# Patient Record
Sex: Female | Born: 1965 | Race: Asian | Hispanic: No | Marital: Married | Smoking: Never smoker
Health system: Southern US, Community
[De-identification: ages and names within clinical notes are randomized; demographics above are authoritative.]

## PROBLEM LIST (undated history)

## (undated) DIAGNOSIS — D241 Benign neoplasm of right breast: Secondary | ICD-10-CM

## (undated) HISTORY — PX: DILATION AND CURETTAGE OF UTERUS: SHX78

## (undated) HISTORY — PX: TUBAL LIGATION: SHX77

## (undated) HISTORY — PX: BREAST BIOPSY: SHX20

---

## 1997-09-25 ENCOUNTER — Emergency Department (HOSPITAL_COMMUNITY): Admission: EM | Admit: 1997-09-25 | Discharge: 1997-09-25 | Payer: Self-pay | Admitting: Emergency Medicine

## 1997-12-29 ENCOUNTER — Ambulatory Visit (HOSPITAL_COMMUNITY): Admission: RE | Admit: 1997-12-29 | Discharge: 1997-12-29 | Payer: Self-pay | Admitting: Obstetrics & Gynecology

## 1998-05-28 ENCOUNTER — Inpatient Hospital Stay (HOSPITAL_COMMUNITY): Admission: AD | Admit: 1998-05-28 | Discharge: 1998-05-28 | Payer: Self-pay | Admitting: *Deleted

## 1998-05-30 ENCOUNTER — Inpatient Hospital Stay (HOSPITAL_COMMUNITY): Admission: AD | Admit: 1998-05-30 | Discharge: 1998-06-02 | Payer: Self-pay | Admitting: Obstetrics & Gynecology

## 2000-01-05 ENCOUNTER — Encounter: Admission: RE | Admit: 2000-01-05 | Discharge: 2000-01-05 | Payer: Self-pay | Admitting: Family Medicine

## 2000-01-05 ENCOUNTER — Encounter: Payer: Self-pay | Admitting: Family Medicine

## 2003-01-18 ENCOUNTER — Inpatient Hospital Stay (HOSPITAL_COMMUNITY): Admission: AD | Admit: 2003-01-18 | Discharge: 2003-01-20 | Payer: Self-pay | Admitting: Obstetrics & Gynecology

## 2003-01-18 ENCOUNTER — Encounter (INDEPENDENT_AMBULATORY_CARE_PROVIDER_SITE_OTHER): Payer: Self-pay | Admitting: Specialist

## 2009-01-28 ENCOUNTER — Other Ambulatory Visit: Admission: RE | Admit: 2009-01-28 | Discharge: 2009-01-28 | Payer: Self-pay | Admitting: Family Medicine

## 2010-08-04 NOTE — Op Note (Signed)
NAME:  Heather Yang, Heather Yang                          ACCOUNT NO.:  [id number]   MEDICAL RECORD NO.:  [id number]                   PATIENT TYPE:  INP   LOCATION:  9137                                 FACILITY:  [facility]   PHYSICIAN:  Charles A. Clearance Coots, M.D.             DATE OF BIRTH:  1965/11/13   DATE OF PROCEDURE:  01/18/2003  DATE OF DISCHARGE:                                 OPERATIVE REPORT   PREOPERATIVE DIAGNOSIS:  Desires sterilization.   POSTOPERATIVE DIAGNOSIS:  Desires sterilization.   PROCEDURE:  Bilateral partial salpingectomy (Pomeroy technique).   SURGEON:  Charles A. Clearance Coots, M.D.   ANESTHESIA:  General.   ESTIMATED BLOOD LOSS:  Negligible.   COMPLICATIONS:  None.   SPECIMENS:  Approximately 2 cm segments of right and left fallopian tubes.   OPERATION:  The patient was brought to the operating room and after  satisfactory general endotracheal anesthesia, the abdomen was prepped and  draped in the usual sterile fashion.  A small inferior umbilical incision  was made with the scalpel that was deepened down to the fascia bluntly with  curved Mayo scissors.  The fascia was grasped in the midline with Kelly  forceps and was cut inbetween the forceps with curved Mayo scissors.  The  fascial incision was extended to the left and the right with the curved Mayo  scissors.  The parietoperitoneum was grasped with hemostats and was incised  with Metzenbaum scissors.  Right angle retractors were placed in the  incision and the right fallopian tube was identified and grasped with  Babcock clamp.  The tube was followed from the cornual end to the fimbrial  end serially and grasped with Babcock clamps and then followed retrograde  back to the isthmic area of the tube with the Babcock clamp.  A knuckle of  tube beneath the Babcock clamp in the isthmic portion of the tube was doubly  ligated with #1 plain catgut and the section of tube above the knot was  excised with Metzenbaum scissors  and submitted to pathology for evaluation.  There was no active bleeding from the tube stump, it was placed back in its  normal anatomic position.  The same procedure was performed on the opposite  side without complications.  The abdomen was then closed as follows:  The  peritoneum and fascia was closed as one with continuous suture of 2-0  Vicryl.  The skin was reapproximated with a continuous subcuticular suture  of 3-0 Monocryl.  A sterile bandage was applied to the incision closure.  The surgical technician indicated that all needle, sponge, and instrument  counts were correct.  The patient tolerated the procedure well and was  transferred to the recovery room in satisfactory condition.  Charles A. Clearance Coots, M.D.    CAH/MEDQ  D:  01/18/2003  T:  01/18/2003  Job:  [id number]

## 2011-11-07 ENCOUNTER — Other Ambulatory Visit: Payer: Self-pay | Admitting: Family Medicine

## 2011-11-07 ENCOUNTER — Other Ambulatory Visit (HOSPITAL_COMMUNITY)
Admission: RE | Admit: 2011-11-07 | Discharge: 2011-11-07 | Disposition: A | Discharge status: Home / Self Care | Payer: BC Managed Care – PPO | Source: Ambulatory Visit | Attending: Family Medicine | Admitting: Family Medicine

## 2011-11-07 DIAGNOSIS — Z0000 Encounter for general adult medical examination without abnormal findings: Secondary | ICD-10-CM | POA: Insufficient documentation

## 2011-11-07 DIAGNOSIS — N6311 Unspecified lump in the right breast, upper outer quadrant: Secondary | ICD-10-CM

## 2011-12-03 ENCOUNTER — Other Ambulatory Visit: Payer: Self-pay | Admitting: Family Medicine

## 2011-12-03 ENCOUNTER — Ambulatory Visit
Admission: RE | Admit: 2011-12-03 | Discharge: 2011-12-03 | Disposition: A | Discharge status: Home / Self Care | Payer: BC Managed Care – PPO | Source: Ambulatory Visit | Attending: Family Medicine | Admitting: Family Medicine

## 2011-12-03 DIAGNOSIS — N6311 Unspecified lump in the right breast, upper outer quadrant: Secondary | ICD-10-CM

## 2012-01-02 ENCOUNTER — Ambulatory Visit
Admission: RE | Admit: 2012-01-02 | Discharge: 2012-01-02 | Disposition: A | Discharge status: Home / Self Care | Payer: BC Managed Care – PPO | Source: Ambulatory Visit | Attending: Family Medicine | Admitting: Family Medicine

## 2012-01-02 ENCOUNTER — Other Ambulatory Visit: Payer: Self-pay | Admitting: Family Medicine

## 2012-01-02 DIAGNOSIS — N6311 Unspecified lump in the right breast, upper outer quadrant: Secondary | ICD-10-CM

## 2013-02-17 ENCOUNTER — Other Ambulatory Visit: Payer: Self-pay | Admitting: Family Medicine

## 2013-02-17 DIAGNOSIS — R921 Mammographic calcification found on diagnostic imaging of breast: Secondary | ICD-10-CM

## 2013-03-03 ENCOUNTER — Ambulatory Visit
Admission: RE | Admit: 2013-03-03 | Discharge: 2013-03-03 | Disposition: A | Discharge status: Home / Self Care | Payer: BC Managed Care – PPO | Source: Ambulatory Visit | Attending: Family Medicine | Admitting: Family Medicine

## 2013-03-03 DIAGNOSIS — R921 Mammographic calcification found on diagnostic imaging of breast: Secondary | ICD-10-CM

## 2014-02-02 ENCOUNTER — Other Ambulatory Visit: Payer: Self-pay | Admitting: Family Medicine

## 2014-02-02 DIAGNOSIS — R921 Mammographic calcification found on diagnostic imaging of breast: Secondary | ICD-10-CM

## 2014-03-08 ENCOUNTER — Ambulatory Visit
Admission: RE | Admit: 2014-03-08 | Discharge: 2014-03-08 | Disposition: A | Discharge status: Home / Self Care | Payer: BC Managed Care – PPO | Source: Ambulatory Visit | Attending: Family Medicine | Admitting: Family Medicine

## 2014-03-08 DIAGNOSIS — R921 Mammographic calcification found on diagnostic imaging of breast: Secondary | ICD-10-CM

## 2015-04-19 ENCOUNTER — Other Ambulatory Visit (HOSPITAL_COMMUNITY)
Admission: RE | Admit: 2015-04-19 | Discharge: 2015-04-19 | Disposition: A | Discharge status: Home / Self Care | Payer: BC Managed Care – PPO | Source: Ambulatory Visit | Attending: Family Medicine | Admitting: Family Medicine

## 2015-04-19 ENCOUNTER — Other Ambulatory Visit: Payer: Self-pay | Admitting: Family Medicine

## 2015-04-19 DIAGNOSIS — Z124 Encounter for screening for malignant neoplasm of cervix: Secondary | ICD-10-CM | POA: Diagnosis present

## 2015-04-19 DIAGNOSIS — Z1151 Encounter for screening for human papillomavirus (HPV): Secondary | ICD-10-CM | POA: Diagnosis not present

## 2015-04-21 LAB — CYTOLOGY - PAP

## 2015-08-08 ENCOUNTER — Other Ambulatory Visit: Payer: Self-pay

## 2015-08-08 DIAGNOSIS — Z1231 Encounter for screening mammogram for malignant neoplasm of breast: Secondary | ICD-10-CM

## 2015-09-01 ENCOUNTER — Ambulatory Visit
Admission: RE | Admit: 2015-09-01 | Discharge: 2015-09-01 | Disposition: A | Discharge status: Home / Self Care | Payer: BLUE CROSS/BLUE SHIELD | Source: Ambulatory Visit

## 2015-09-01 ENCOUNTER — Other Ambulatory Visit: Payer: Self-pay | Admitting: Family Medicine

## 2015-09-01 DIAGNOSIS — Z1231 Encounter for screening mammogram for malignant neoplasm of breast: Secondary | ICD-10-CM

## 2016-08-23 ENCOUNTER — Emergency Department (HOSPITAL_COMMUNITY)
Admission: EM | Admit: 2016-08-23 | Discharge: 2016-08-23 | Disposition: A | Discharge status: Home / Self Care | Payer: BLUE CROSS/BLUE SHIELD | Attending: Emergency Medicine | Admitting: Emergency Medicine

## 2016-08-23 ENCOUNTER — Encounter (HOSPITAL_COMMUNITY): Payer: Self-pay | Admitting: Emergency Medicine

## 2016-08-23 DIAGNOSIS — S71152A Open bite, left thigh, initial encounter: Secondary | ICD-10-CM | POA: Insufficient documentation

## 2016-08-23 DIAGNOSIS — Y939 Activity, unspecified: Secondary | ICD-10-CM | POA: Diagnosis not present

## 2016-08-23 DIAGNOSIS — Y929 Unspecified place or not applicable: Secondary | ICD-10-CM | POA: Insufficient documentation

## 2016-08-23 DIAGNOSIS — W5501XA Bitten by cat, initial encounter: Secondary | ICD-10-CM | POA: Diagnosis not present

## 2016-08-23 DIAGNOSIS — S51852A Open bite of left forearm, initial encounter: Secondary | ICD-10-CM | POA: Insufficient documentation

## 2016-08-23 DIAGNOSIS — Z23 Encounter for immunization: Secondary | ICD-10-CM | POA: Insufficient documentation

## 2016-08-23 DIAGNOSIS — Y999 Unspecified external cause status: Secondary | ICD-10-CM | POA: Diagnosis not present

## 2016-08-23 DIAGNOSIS — S61452A Open bite of left hand, initial encounter: Secondary | ICD-10-CM | POA: Diagnosis not present

## 2016-08-23 MED ORDER — LIDOCAINE-EPINEPHRINE-TETRACAINE (LET) SOLUTION
3.0000 mL | Freq: Once | NASAL | Status: AC
  Administered 2016-08-23: 3 mL via TOPICAL

## 2016-08-23 MED ORDER — AMOXICILLIN-POT CLAVULANATE 875-125 MG PO TABS: 1.0000 | ORAL_TABLET | Freq: Two times a day (BID) | ORAL | 0 refills | Status: DC

## 2016-08-23 MED ORDER — TETANUS-DIPHTH-ACELL PERTUSSIS 5-2.5-18.5 LF-MCG/0.5 IM SUSP
0.5000 mL | Freq: Once | INTRAMUSCULAR | Status: AC
  Administered 2016-08-23: 0.5 mL via INTRAMUSCULAR
  Filled 2016-08-23: qty 0.5

## 2016-08-23 MED ORDER — AMOXICILLIN-POT CLAVULANATE 875-125 MG PO TABS
1.0000 | ORAL_TABLET | Freq: Once | ORAL | Status: AC
Start: 2016-08-23 — End: 2016-08-23
  Administered 2016-08-23: 1 via ORAL
  Filled 2016-08-23: qty 1

## 2016-08-23 NOTE — ED Triage Notes (Signed)
Pt reports she was attacked by a neighbors cat last night, states animal control came out and they were told the cat was up to date on its rabies shots. Pt has puncture marks to left arm.

## 2016-08-23 NOTE — Discharge Instructions (Signed)
Take the prescribed medication as directed.  Keep bites clean with soap and warm water. Follow-up with your primary care doctor. Return to the ED for new or worsening symptoms-- -severe swelling, redness, high fever, chills, numbness, etc. Of the affected areas.

## 2016-08-23 NOTE — ED Notes (Signed)
Pt ambulated to room from waiting area, pt had no complaints while ambulating.

## 2016-08-23 NOTE — ED Provider Notes (Signed)
[facility] DEPT Provider Note   CSN: [id number] Arrival date & time: 08/23/16  8676     History   Chief Complaint Chief Complaint  Patient presents with  . Animal Bite    HPI Heather Yang is a 51 y.o. female.  The history is provided by the patient and medical records.   51 year old female with no significant past medical history presenting to the ED after being attacked by her neighbors cat.  States this occurred last night. Patient husband did contact animal control, ulnar reports Is up-to-date on vaccinations and is currently in the process of turning it over to animal control for close monitoring. Patient has multiple puncture bites to left forearm, left hand, and left thigh.  States last tetanus was about 18 years ago.  States hand has been bleeding a little throughout the night.  Not on anticoagulation.  History reviewed. No pertinent past medical history.  There are no active problems to display for this patient.   History reviewed. No pertinent surgical history.  OB History    No data available       Home Medications    Prior to Admission medications   Not on File    Family History No family history on file.  Social History Social History  Substance Use Topics  . Smoking status: Not on file  . Smokeless tobacco: Not on file  . Alcohol use Not on file     Allergies   Patient has no known allergies.   Review of Systems Review of Systems  Skin: Positive for wound.  All other systems reviewed and are negative.    Physical Exam Updated Vital Signs BP 124/86   Pulse 78   Temp 98.1 F (36.7 C) (Oral)   Resp 12   SpO2 100%   Physical Exam  Constitutional: She is oriented to person, place, and time. She appears well-developed and well-nourished.  HENT:  Head: Normocephalic and atraumatic.  Mouth/Throat: Oropharynx is clear and moist.  Eyes: Conjunctivae and EOM are normal. Pupils are equal, round, and reactive to light.  Neck: Normal  range of motion.  Cardiovascular: Normal rate, regular rhythm and normal heart sounds.   Pulmonary/Chest: Effort normal and breath sounds normal.  Abdominal: Soft. Bowel sounds are normal.  Musculoskeletal: Normal range of motion.  Multiple puncture wounds to left dorsal hand, left forearm, and left lateral thigh, superficial bleeding noted from one wound of the left dorsal hand, there is no bony deformity, mild swelling and bruising surrounding the punctures; compartments are soft and easily compressible, no tissue crepitus, no purlent draiange See photos below  Neurological: She is alert and oriented to person, place, and time.  Skin: Skin is warm and dry.  Psychiatric: She has a normal mood and affect.  Nursing note and vitals reviewed.   Marland Kitchen left hand    ^^ Left forearm   ^^ left thigh   ED Treatments / Results  Labs (all labs ordered are listed, but only abnormal results are displayed) Labs Reviewed - No data to display  EKG  EKG Interpretation None       Radiology No results found.  Procedures Procedures (including critical care time)  Medications Ordered in ED Medications  Tdap (BOOSTRIX) injection 0.5 mL (0.5 mLs Intramuscular Given 08/23/16 0902)  lidocaine-EPINEPHrine-tetracaine (LET) solution (3 mLs Topical Given 08/23/16 0900)  amoxicillin-clavulanate (AUGMENTIN) 875-125 MG per tablet 1 tablet (1 tablet Oral Given 08/23/16 0903)     Initial Impression / Assessment and Plan /  ED Course  I have reviewed the triage vital signs and the nursing notes.  Pertinent labs & imaging results that were available during my care of the patient were reviewed by me and considered in my medical decision making (see chart for details).  51 year old female here after cat bite last night by neighbors cat.  Cat is fully vaccinated, animal control was already contacted.  Patient has puncture wounds noted to left dorsal hand, left forearm, and left thigh.  Affected areas do have  some mild swelling and bruising but no purulent drainage or other signs of infection at this time.  No tissue crepitus, compartments are soft and easily compressible.  Wounds were cleansed and dressed here.  Tetanus updated.  Will start on augmentin, first dose given here.  Discussed continued home wound care.  Will need close follow-up with PCP.  Discussed plan with patient, he/she acknowledged understanding and agreed with plan of care.  Return precautions given for new or worsening symptoms such as severe swelling, redness, fever, chills, numbness of affected areas, etc.  Final Clinical Impressions(s) / ED Diagnoses   Final diagnoses:  Cat bite of multiple sites    New Prescriptions Discharge Medication List as of 08/23/2016 11:10 AM    START taking these medications   Details  amoxicillin-clavulanate (AUGMENTIN) 875-125 MG tablet Take 1 tablet by mouth every 12 (twelve) hours., Starting Thu 08/23/2016, Print         Larene Pickett, PA-C 08/23/16 1127    Tegeler, Gwenyth Allegra, MD 08/23/16 [phone]

## 2016-09-06 ENCOUNTER — Other Ambulatory Visit: Payer: Self-pay | Admitting: Family Medicine

## 2016-09-06 DIAGNOSIS — Z1231 Encounter for screening mammogram for malignant neoplasm of breast: Secondary | ICD-10-CM

## 2016-09-24 ENCOUNTER — Ambulatory Visit: Payer: BLUE CROSS/BLUE SHIELD

## 2016-10-02 ENCOUNTER — Ambulatory Visit
Admission: RE | Admit: 2016-10-02 | Discharge: 2016-10-02 | Disposition: A | Discharge status: Home / Self Care | Payer: BLUE CROSS/BLUE SHIELD | Source: Ambulatory Visit | Attending: Family Medicine | Admitting: Family Medicine

## 2016-10-02 DIAGNOSIS — Z1231 Encounter for screening mammogram for malignant neoplasm of breast: Secondary | ICD-10-CM

## 2017-09-12 ENCOUNTER — Other Ambulatory Visit: Payer: Self-pay | Admitting: Family Medicine

## 2017-09-12 DIAGNOSIS — Z1231 Encounter for screening mammogram for malignant neoplasm of breast: Secondary | ICD-10-CM

## 2017-10-03 ENCOUNTER — Ambulatory Visit
Admission: RE | Admit: 2017-10-03 | Discharge: 2017-10-03 | Disposition: A | Discharge status: Home / Self Care | Payer: No Typology Code available for payment source | Source: Ambulatory Visit | Attending: Family Medicine | Admitting: Family Medicine

## 2017-10-03 DIAGNOSIS — Z1231 Encounter for screening mammogram for malignant neoplasm of breast: Secondary | ICD-10-CM

## 2017-10-04 ENCOUNTER — Other Ambulatory Visit: Payer: Self-pay | Admitting: Family Medicine

## 2017-10-04 DIAGNOSIS — R928 Other abnormal and inconclusive findings on diagnostic imaging of breast: Secondary | ICD-10-CM

## 2017-10-08 ENCOUNTER — Other Ambulatory Visit: Payer: No Typology Code available for payment source

## 2017-10-16 ENCOUNTER — Other Ambulatory Visit (HOSPITAL_COMMUNITY): Payer: Self-pay | Admitting: *Deleted

## 2017-10-16 ENCOUNTER — Ambulatory Visit: Payer: No Typology Code available for payment source

## 2017-10-16 DIAGNOSIS — R928 Other abnormal and inconclusive findings on diagnostic imaging of breast: Secondary | ICD-10-CM

## 2017-10-29 ENCOUNTER — Encounter (HOSPITAL_COMMUNITY): Payer: Self-pay

## 2017-10-29 ENCOUNTER — Ambulatory Visit (HOSPITAL_COMMUNITY)
Admission: RE | Admit: 2017-10-29 | Discharge: 2017-10-29 | Disposition: A | Discharge status: Home / Self Care | Payer: No Typology Code available for payment source | Source: Ambulatory Visit | Attending: Obstetrics and Gynecology | Admitting: Obstetrics and Gynecology

## 2017-10-29 VITALS — BP 130/78 | Ht 61.0 in

## 2017-10-29 DIAGNOSIS — Z1239 Encounter for other screening for malignant neoplasm of breast: Secondary | ICD-10-CM

## 2017-10-29 NOTE — Addendum Note (Signed)
Encounter addended by: Loletta Parish, RN on: 10/29/2017 3:54 PM  Actions taken: Sign clinical note

## 2017-10-29 NOTE — Patient Instructions (Signed)
Explained breast self awareness with Barnie Del. Patient did not need a Pap smear today due to last Pap smear and HPV typing was 04/19/2015. Let her know [facility] will cover Pap smears and HPV typing every 5 years unless has a history of abnormal Pap smears. Referred patient to the [facility] for a right breast diagnostic mammogram and ultrasound per recommendation. Appointment scheduled for Thursday, October 31, 2017 at 0920. Patient aware of appointment and will be there.  Heather Yang verbalized understanding.  [place] Sorn, Arvil Chaco, RN 3:23 PM

## 2017-10-29 NOTE — Progress Notes (Addendum)
Patient referred to [facility] by the [facility] due to recommending additional imaging of the right breast. Screening mammogram completed 10/03/2017.  Pap Smear: Pap smear not completed today. Last Pap smear was 04/19/2015 at [facility] and normal with negative HPV. Per patient has no history of an abnormal Pap smear. Last Pap smear result is in Epic.  Physical exam: Breasts Breasts symmetrical. No skin abnormalities bilateral breasts. Bilateral nipple inversion that per patient is normal for her. No nipple discharge bilateral breasts. No lymphadenopathy. No lumps palpated bilateral breasts. No complaints of pain or tenderness on exam. Referred patient to the [facility] for a right breast diagnostic mammogram and ultrasound per recommendation. Appointment scheduled for Thursday, October 31, 2017 at 0920.        Pelvic/Bimanual No Pap smear completed today since last Pap smear and HPV typing was 04/19/2015. Pap smear not indicated per [facility]CP guidelines.   Smoking History: Patient has never smoked.  Patient Navigation: Patient education provided. Access to services provided for patient through [place] program.   Colorectal Cancer Screening: Per patient had a colonoscopy completed 2-3 years ago. No complaints today. FIT Test given to patient to complete and return to [facility].  Breast and Cervical Cancer Risk Assessment: Patient has no family history of breast cancer, known genetic mutations, or radiation treatment to the chest before age 64. Patient has no history of cervical dysplasia, immunocompromised, or DES exposure in-utero. Risk Assessment    Risk Scores      10/29/2017   Last edited by: Loletta Parish, RN   5-year risk: 1.4 %   Lifetime risk: 7.2 %

## 2017-10-31 ENCOUNTER — Other Ambulatory Visit (HOSPITAL_COMMUNITY): Payer: Self-pay | Admitting: Obstetrics and Gynecology

## 2017-10-31 ENCOUNTER — Ambulatory Visit
Admission: RE | Admit: 2017-10-31 | Discharge: 2017-10-31 | Disposition: A | Discharge status: Home / Self Care | Payer: No Typology Code available for payment source | Source: Ambulatory Visit | Attending: Obstetrics and Gynecology | Admitting: Obstetrics and Gynecology

## 2017-10-31 DIAGNOSIS — R928 Other abnormal and inconclusive findings on diagnostic imaging of breast: Secondary | ICD-10-CM

## 2017-10-31 DIAGNOSIS — N6310 Unspecified lump in the right breast, unspecified quadrant: Secondary | ICD-10-CM

## 2017-11-01 ENCOUNTER — Other Ambulatory Visit: Payer: Self-pay | Admitting: Obstetrics and Gynecology

## 2017-11-06 ENCOUNTER — Other Ambulatory Visit: Payer: No Typology Code available for payment source

## 2017-11-06 ENCOUNTER — Ambulatory Visit
Admission: RE | Admit: 2017-11-06 | Discharge: 2017-11-06 | Disposition: A | Discharge status: Home / Self Care | Payer: No Typology Code available for payment source | Source: Ambulatory Visit | Attending: Obstetrics and Gynecology | Admitting: Obstetrics and Gynecology

## 2017-11-06 ENCOUNTER — Other Ambulatory Visit (HOSPITAL_COMMUNITY): Payer: Self-pay | Admitting: Obstetrics and Gynecology

## 2017-11-06 DIAGNOSIS — N6310 Unspecified lump in the right breast, unspecified quadrant: Secondary | ICD-10-CM

## 2017-11-12 ENCOUNTER — Encounter (HOSPITAL_COMMUNITY): Payer: Self-pay

## 2017-11-12 LAB — FECAL OCCULT BLOOD, IMMUNOCHEMICAL: FECAL OCCULT BLD: NEGATIVE

## 2018-09-04 ENCOUNTER — Other Ambulatory Visit: Payer: Self-pay | Admitting: Obstetrics and Gynecology

## 2018-09-04 DIAGNOSIS — N63 Unspecified lump in unspecified breast: Secondary | ICD-10-CM

## 2018-09-05 ENCOUNTER — Ambulatory Visit
Admission: RE | Admit: 2018-09-05 | Discharge: 2018-09-05 | Disposition: A | Discharge status: Home / Self Care | Payer: BC Managed Care – PPO | Source: Ambulatory Visit | Attending: Family Medicine | Admitting: Family Medicine

## 2018-09-05 ENCOUNTER — Other Ambulatory Visit: Payer: Self-pay

## 2018-09-05 DIAGNOSIS — N63 Unspecified lump in unspecified breast: Secondary | ICD-10-CM

## 2019-09-08 ENCOUNTER — Ambulatory Visit: Payer: Self-pay | Admitting: Surgery

## 2019-09-08 DIAGNOSIS — D241 Benign neoplasm of right breast: Secondary | ICD-10-CM

## 2019-09-08 NOTE — H&P (Signed)
Heather Yang Appointment: 09/08/2019 2:10 PM Location: [facility] Patient #: [id number] DOB: 10-May-1965 Married / Language: English / Race: Asian Female  History of Present Illness Heather Yang A. 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 She has opted for right breast lumpectomy and returns to get that scheduled. She has no further questions about the procedure and no new complaints today.       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 Patient presents for six-month follow-up after a re-biopsy in August 2019 which again demonstrated fibroadenoma.  EXAM: ULTRASOUND OF THE RIGHT BREAST  COMPARISON: Previous exam(s).  FINDINGS: Targeted ultrasound is performed, showing a circumscribed lobulated hypoechoic mass at the 12 o'clock position 3 cm from the nipple. It measures 3.0 x 2.8 x 1.5 cm (previously 2.7 x 2.2 x 1.3 cm in August 2019). There appears to be significant interval growth of 1 of the lobules as seen in the radial projection.  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 Given the continued changes over time, recommendation is for surgical excision.  RECOMMENDATION: Surgical consultation for excision of an enlarging right breast mass.  Patient is due for annual bilateral mammography in July 2020.  I have discussed the findings and recommendations with the patient. Results were also provided in writing at the conclusion of the visit. If applicable, a reminder letter will be sent to the patient regarding the next appointment.  BI-RADS CATEGORY 4: Suspicious.      Diagnosis Breast, right, needle core biopsy, upper, 12 o'clock position - FIBROADENOMA. - NO EVIDENCE OF MALIGNANCY.  The patient is a 54 year old female.   Allergies (Chanel Teressa Senter, CMA; 09/08/2019 2:25 PM) No Known Allergies  [04/17/2019]: No Known Drug Allergies [09/15/2018]: Allergies Reconciled  Medication History (Chanel Teressa Senter, [place]; 09/08/2019 2:25 PM) Vitamin D (Oral) Specific strength unknown - Active. Vitamin E (Oral) Specific strength unknown - Active. Medications Reconciled    Vitals (Chanel Nolan CMA; 09/08/2019 2:25 PM) 09/08/2019 2:25 PM Weight: 143.25 lb Height: 61in Body Surface Area: 1.64 m Body Mass Index: 27.07 kg/m  Temp.: 96.61F  Pulse: 69 (Regular)         Physical Exam (Karion Cudd A. Mackie Holness MD; 09/08/2019 5:33 PM)  General Mental Status-Alert. General Appearance-Consistent with stated age. Hydration-Well hydrated. Voice-Normal.  Breast Note: My impression is bilateral 3 cm upper outer quadrant mass corresponding to fibroadenoma left breast is normal.  Cardiovascular Cardiovascular examination reveals -normal heart sounds, regular rate and rhythm with no murmurs and normal pedal pulses bilaterally.  Neurologic Neurologic evaluation reveals -alert and oriented x 3 with no impairment of recent or remote memory. Mental Status-Normal.  Musculoskeletal Normal Exam - Left-Upper Extremity Strength Normal and Lower Extremity Strength Normal. Normal Exam - Right-Upper Extremity Strength Normal and Lower Extremity Strength Normal.  Lymphatic Head & Neck  General Head & Neck Lymphatics: Bilateral - Description - Normal. Axillary  General Axillary Region: Bilateral - Description - Normal. Tenderness - Non Tender.    Assessment & Plan (Stirling Orton A. Eman Rynders MD; 09/08/2019 2:47 PM)  Rodman Comp, RIGHT (D24.1) Impression: pt desires right breast seed lumpectomy Risk of lumpectomy include bleeding, infection, seroma, more surgery, use of seed/wire, wound care, cosmetic deformity and the need for other treatments, death , blood clots, death. Pt agrees to proceed.  Current Plans Pt Education - CCS Free Text Education/Instructions: discussed with  patient and provided information. You are being scheduled for  surgery- Our schedulers will call you.  You should hear from our office's scheduling department within 5 working days about the location, date, and time of surgery. We try to make accommodations for patient's preferences in scheduling surgery, but sometimes the OR schedule or the surgeon's schedule prevents [place] from making those accommodations.  If you have not heard from our office [phone]) in 5 working days, call the office and ask for your surgeon's nurse.  If you have other questions about your diagnosis, plan, or surgery, call the office and ask for your surgeon's nurse.

## 2019-09-16 ENCOUNTER — Other Ambulatory Visit: Payer: Self-pay | Admitting: Surgery

## 2019-09-16 DIAGNOSIS — D241 Benign neoplasm of right breast: Secondary | ICD-10-CM

## 2019-10-30 ENCOUNTER — Other Ambulatory Visit (HOSPITAL_COMMUNITY): Payer: BC Managed Care – PPO

## 2019-11-03 ENCOUNTER — Ambulatory Visit (HOSPITAL_BASED_OUTPATIENT_CLINIC_OR_DEPARTMENT_OTHER): Admission: RE | Admit: 2019-11-03 | Payer: BC Managed Care – PPO | Source: Home / Self Care | Admitting: Surgery

## 2019-11-03 ENCOUNTER — Encounter (HOSPITAL_BASED_OUTPATIENT_CLINIC_OR_DEPARTMENT_OTHER): Admission: RE | Payer: Self-pay | Source: Home / Self Care

## 2019-11-03 SURGERY — BREAST LUMPECTOMY WITH RADIOACTIVE SEED LOCALIZATION
Anesthesia: General | Site: Breast | Laterality: Right

## 2020-07-18 ENCOUNTER — Ambulatory Visit: Payer: Self-pay | Admitting: Surgery

## 2020-07-18 DIAGNOSIS — D241 Benign neoplasm of right breast: Secondary | ICD-10-CM

## 2020-07-18 NOTE — H&P (View-Only) (Signed)
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 She has opted for right breast lumpectomy and returns to get that scheduled. She has no further questions about the procedure and no new complaints today.       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 Patient presents for six-month follow-up after a re-biopsy in August 2019 which again demonstrated fibroadenoma.  EXAM: ULTRASOUND OF THE RIGHT BREAST  COMPARISON: Previous exam(s).  FINDINGS: Targeted ultrasound is performed, showing a circumscribed lobulated hypoechoic mass at the 12 o'clock position 3 cm from the nipple. It measures 3.0 x 2.8 x 1.5 cm (previously 2.7 x 2.2 x 1.3 cm in August 2019). There appears to be significant interval growth of 1 of the lobules as seen in the radial projection.  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 Given the continued changes over time, recommendation is for surgical excision.  RECOMMENDATION: Surgical consultation for excision of an enlarging right breast mass.  Patient is due for annual bilateral mammography in July 2020.  I have discussed the findings and recommendations with the patient. Results were also provided in writing at the conclusion of the visit. If applicable, a reminder letter will be sent to the patient regarding the next appointment.  BI-RADS CATEGORY 4: Suspicious.      Diagnosis Breast, right, needle core biopsy, upper, 12 o'clock position - FIBROADENOMA. - NO EVIDENCE OF MALIGNANCY.  The patient is a 55 year old female.    Physical Exam (Heather Yang; 07/18/2020 10:47 AM)  Head and  Neck Head-normocephalic, atraumatic with no lesions or palpable masses. Trachea-midline. Thyroid Gland Characteristics - normal size and consistency.  Breast Note: 4 cm upper outer quadrant mass corresponding to fibroadenoma left breast is normal.  Neurologic Neurologic evaluation reveals -alert and oriented x 3 with no impairment of recent or remote memory. Mental Status-Normal.  Musculoskeletal Normal Exam - Left-Upper Extremity Strength Normal and Lower Extremity Strength Normal. Normal Exam - Right-Upper Extremity Strength Normal and Lower Extremity Strength Normal.  Lymphatic Axillary  General Axillary Region: Bilateral - Description - Normal. Tenderness - Non Tender.    Assessment & Plan (Heather Yang; 07/18/2020 10:48 AM)  Heather Yang, RIGHT (D24.1) Impression: pt desires right breast seed lumpectomy Risk of lumpectomy include bleeding, infection, seroma, more surgery, use of seed/wire, wound care, cosmetic deformity and the need for other treatments, death , blood clots, death. Pt agrees to proceed.  20 min  Current Plans Pt Education - CCS Free Text Education/Instructions: discussed with patient and provided information. Pt Education - CCS Breast Biopsy HCI: discussed with patient and provided information. The anatomy and the physiology was discussed. The pathophysiology and natural history of the disease was discussed. Options were discussed and recommendations were made. Technique, risks, benefits, & alternatives were discussed. Risks such as stroke, heart attack, bleeding, indection, death, and other risks discussed. Questions answered. The patient agrees to proceed.

## 2020-07-18 NOTE — H&P (Signed)
Heather Yang Appointment: 07/18/2020 10:40 AM Location: [facility] Patient #: [id number] DOB: 07/31/1965 Married / Language: English / Race: Asian Female  History of Present Illness (Giordana Weinheimer A. 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 She has opted for right breast lumpectomy and returns to get that scheduled. She has no further questions about the procedure and no new complaints today.       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 Patient presents for six-month follow-up after a re-biopsy in August 2019 which again demonstrated fibroadenoma.  EXAM: ULTRASOUND OF THE RIGHT BREAST  COMPARISON: Previous exam(s).  FINDINGS: Targeted ultrasound is performed, showing a circumscribed lobulated hypoechoic mass at the 12 o'clock position 3 cm from the nipple. It measures 3.0 x 2.8 x 1.5 cm (previously 2.7 x 2.2 x 1.3 cm in August 2019). There appears to be significant interval growth of 1 of the lobules as seen in the radial projection.  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 Given the continued changes over time, recommendation is for surgical excision.  RECOMMENDATION: Surgical consultation for excision of an enlarging right breast mass.  Patient is due for annual bilateral mammography in July 2020.  I have discussed the findings and recommendations with the patient. Results were also provided in writing at the conclusion of the visit. If applicable, a reminder letter will be sent to the patient regarding the next appointment.  BI-RADS CATEGORY 4: Suspicious.      Diagnosis Breast, right, needle core biopsy, upper, 12 o'clock position - FIBROADENOMA. - NO EVIDENCE OF MALIGNANCY.  The patient is a 55 year old female.    Physical Exam (Rush Salce A. Trenton Passow MD; 07/18/2020 10:47 AM)  Head and  Neck Head-normocephalic, atraumatic with no lesions or palpable masses. Trachea-midline. Thyroid Gland Characteristics - normal size and consistency.  Breast Note: 4 cm upper outer quadrant mass corresponding to fibroadenoma left breast is normal.  Neurologic Neurologic evaluation reveals -alert and oriented x 3 with no impairment of recent or remote memory. Mental Status-Normal.  Musculoskeletal Normal Exam - Left-Upper Extremity Strength Normal and Lower Extremity Strength Normal. Normal Exam - Right-Upper Extremity Strength Normal and Lower Extremity Strength Normal.  Lymphatic Axillary  General Axillary Region: Bilateral - Description - Normal. Tenderness - Non Tender.    Assessment & Plan (Kekai Geter A. Marinna Blane MD; 07/18/2020 10:48 AM)  FIBROADENOMA, RIGHT (D24.1) Impression: pt desires right breast seed lumpectomy Risk of lumpectomy include bleeding, infection, seroma, more surgery, use of seed/wire, wound care, cosmetic deformity and the need for other treatments, death , blood clots, death. Pt agrees to proceed.  20 min  Current Plans Pt Education - CCS Free Text Education/Instructions: discussed with patient and provided information. Pt Education - [facility] Breast Biopsy HCI: discussed with patient and provided information. The anatomy and the physiology was discussed. The pathophysiology and natural history of the disease was discussed. Options were discussed and recommendations were made. Technique, risks, benefits, & alternatives were discussed. Risks such as stroke, heart attack, bleeding, indection, death, and other risks discussed. Questions answered. The patient agrees to proceed. 

## 2020-07-21 ENCOUNTER — Other Ambulatory Visit: Payer: Self-pay | Admitting: Surgery

## 2020-07-21 DIAGNOSIS — D241 Benign neoplasm of right breast: Secondary | ICD-10-CM

## 2020-08-03 ENCOUNTER — Other Ambulatory Visit: Payer: Self-pay

## 2020-08-03 ENCOUNTER — Encounter (HOSPITAL_BASED_OUTPATIENT_CLINIC_OR_DEPARTMENT_OTHER): Payer: Self-pay | Admitting: Surgery

## 2020-08-08 ENCOUNTER — Other Ambulatory Visit (HOSPITAL_COMMUNITY): Payer: BC Managed Care – PPO

## 2020-08-09 ENCOUNTER — Other Ambulatory Visit: Payer: Self-pay

## 2020-08-09 ENCOUNTER — Other Ambulatory Visit: Payer: Self-pay | Admitting: Surgery

## 2020-08-09 ENCOUNTER — Ambulatory Visit
Admission: RE | Admit: 2020-08-09 | Discharge: 2020-08-09 | Disposition: A | Discharge status: Home / Self Care | Payer: BC Managed Care – PPO | Source: Ambulatory Visit | Attending: Surgery | Admitting: Surgery

## 2020-08-09 DIAGNOSIS — D241 Benign neoplasm of right breast: Secondary | ICD-10-CM

## 2020-08-10 ENCOUNTER — Other Ambulatory Visit: Payer: Self-pay

## 2020-08-10 ENCOUNTER — Ambulatory Visit
Admission: RE | Admit: 2020-08-10 | Discharge: 2020-08-10 | Disposition: A | Discharge status: Home / Self Care | Payer: BC Managed Care – PPO | Source: Ambulatory Visit | Attending: Surgery | Admitting: Surgery

## 2020-08-10 ENCOUNTER — Ambulatory Visit (HOSPITAL_BASED_OUTPATIENT_CLINIC_OR_DEPARTMENT_OTHER)
Admission: RE | Admit: 2020-08-10 | Discharge: 2020-08-10 | Disposition: A | Discharge status: Home / Self Care | Payer: BC Managed Care – PPO | Attending: Surgery | Admitting: Surgery

## 2020-08-10 ENCOUNTER — Encounter (HOSPITAL_BASED_OUTPATIENT_CLINIC_OR_DEPARTMENT_OTHER): Payer: Self-pay | Admitting: Surgery

## 2020-08-10 ENCOUNTER — Ambulatory Visit (HOSPITAL_BASED_OUTPATIENT_CLINIC_OR_DEPARTMENT_OTHER): Payer: BC Managed Care – PPO | Admitting: Certified Registered"

## 2020-08-10 ENCOUNTER — Encounter (HOSPITAL_BASED_OUTPATIENT_CLINIC_OR_DEPARTMENT_OTHER)
Admission: RE | Disposition: A | Discharge status: Home / Self Care | Payer: Self-pay | Source: Home / Self Care | Attending: Surgery

## 2020-08-10 DIAGNOSIS — D241 Benign neoplasm of right breast: Secondary | ICD-10-CM | POA: Insufficient documentation

## 2020-08-10 DIAGNOSIS — N6021 Fibroadenosis of right breast: Secondary | ICD-10-CM | POA: Insufficient documentation

## 2020-08-10 HISTORY — PX: BREAST LUMPECTOMY WITH RADIOACTIVE SEED LOCALIZATION: SHX6424

## 2020-08-10 HISTORY — DX: Benign neoplasm of right breast: D24.1

## 2020-08-10 SURGERY — BREAST LUMPECTOMY WITH RADIOACTIVE SEED LOCALIZATION
Anesthesia: General | Site: Breast | Laterality: Right

## 2020-08-10 MED ORDER — VANCOMYCIN HCL 500 MG IV SOLR
INTRAVENOUS | Status: DC | PRN
  Administered 2020-08-10: 500 mg via TOPICAL

## 2020-08-10 MED ORDER — CELECOXIB 200 MG PO CAPS
ORAL_CAPSULE | ORAL | Status: AC
  Filled 2020-08-10: qty 1

## 2020-08-10 MED ORDER — LACTATED RINGERS IV SOLN: INTRAVENOUS | Status: DC

## 2020-08-10 MED ORDER — CEFAZOLIN SODIUM-DEXTROSE 2-4 GM/100ML-% IV SOLN
INTRAVENOUS | Status: AC
  Filled 2020-08-10: qty 100

## 2020-08-10 MED ORDER — MIDAZOLAM HCL 5 MG/5ML IJ SOLN
INTRAMUSCULAR | Status: DC | PRN
  Administered 2020-08-10: 2 mg via INTRAVENOUS

## 2020-08-10 MED ORDER — CEFAZOLIN SODIUM-DEXTROSE 2-4 GM/100ML-% IV SOLN
2.0000 g | INTRAVENOUS | Status: AC
  Administered 2020-08-10: 2 g via INTRAVENOUS

## 2020-08-10 MED ORDER — CHLORHEXIDINE GLUCONATE CLOTH 2 % EX PADS: 6.0000 | MEDICATED_PAD | Freq: Once | CUTANEOUS | Status: DC

## 2020-08-10 MED ORDER — SODIUM CHLORIDE 0.9 % IV SOLN
INTRAVENOUS | Status: AC
  Filled 2020-08-10: qty 10

## 2020-08-10 MED ORDER — PROPOFOL 10 MG/ML IV BOLUS
INTRAVENOUS | Status: AC
  Filled 2020-08-10: qty 20

## 2020-08-10 MED ORDER — LIDOCAINE 2% (20 MG/ML) 5 ML SYRINGE
INTRAMUSCULAR | Status: AC
  Filled 2020-08-10: qty 5

## 2020-08-10 MED ORDER — VANCOMYCIN HCL 500 MG IV SOLR
INTRAVENOUS | Status: AC
  Filled 2020-08-10: qty 500

## 2020-08-10 MED ORDER — FENTANYL CITRATE (PF) 100 MCG/2ML IJ SOLN
INTRAMUSCULAR | Status: DC | PRN
  Administered 2020-08-10 (×2): 25 ug via INTRAVENOUS

## 2020-08-10 MED ORDER — CELECOXIB 200 MG PO CAPS
200.0000 mg | ORAL_CAPSULE | ORAL | Status: AC
Start: 2020-08-11 — End: 2020-08-10
  Administered 2020-08-10: 200 mg via ORAL

## 2020-08-10 MED ORDER — BUPIVACAINE-EPINEPHRINE (PF) 0.25% -1:200000 IJ SOLN
INTRAMUSCULAR | Status: DC | PRN
  Administered 2020-08-10: 20 mL

## 2020-08-10 MED ORDER — PROPOFOL 10 MG/ML IV BOLUS
INTRAVENOUS | Status: DC | PRN
  Administered 2020-08-10: 120 mg via INTRAVENOUS

## 2020-08-10 MED ORDER — ACETAMINOPHEN 500 MG PO TABS
ORAL_TABLET | ORAL | Status: AC
  Filled 2020-08-10: qty 2

## 2020-08-10 MED ORDER — ONDANSETRON HCL 4 MG/2ML IJ SOLN
INTRAMUSCULAR | Status: AC
  Filled 2020-08-10: qty 2

## 2020-08-10 MED ORDER — DEXAMETHASONE SODIUM PHOSPHATE 10 MG/ML IJ SOLN
INTRAMUSCULAR | Status: DC | PRN
  Administered 2020-08-10: 5 mg via INTRAVENOUS

## 2020-08-10 MED ORDER — IBUPROFEN 800 MG PO TABS: 800.0000 mg | ORAL_TABLET | Freq: Three times a day (TID) | ORAL | 0 refills | Status: AC | PRN

## 2020-08-10 MED ORDER — ACETAMINOPHEN 500 MG PO TABS
1000.0000 mg | ORAL_TABLET | ORAL | Status: AC
  Administered 2020-08-10: 1000 mg via ORAL

## 2020-08-10 MED ORDER — MIDAZOLAM HCL 2 MG/2ML IJ SOLN
INTRAMUSCULAR | Status: AC
  Filled 2020-08-10: qty 2

## 2020-08-10 MED ORDER — OXYCODONE HCL 5 MG PO TABS: 5.0000 mg | ORAL_TABLET | Freq: Once | ORAL | Status: DC | PRN

## 2020-08-10 MED ORDER — LIDOCAINE HCL (CARDIAC) PF 100 MG/5ML IV SOSY
PREFILLED_SYRINGE | INTRAVENOUS | Status: DC | PRN
  Administered 2020-08-10: 50 mg via INTRATRACHEAL

## 2020-08-10 MED ORDER — OXYCODONE HCL 5 MG/5ML PO SOLN: 5.0000 mg | Freq: Once | ORAL | Status: DC | PRN

## 2020-08-10 MED ORDER — FENTANYL CITRATE (PF) 100 MCG/2ML IJ SOLN: 25.0000 ug | INTRAMUSCULAR | Status: DC | PRN

## 2020-08-10 MED ORDER — SODIUM CHLORIDE 0.9 % IV SOLN
INTRAVENOUS | Status: DC | PRN
  Administered 2020-08-10: 200 mL

## 2020-08-10 MED ORDER — FENTANYL CITRATE (PF) 100 MCG/2ML IJ SOLN
INTRAMUSCULAR | Status: AC
  Filled 2020-08-10: qty 2

## 2020-08-10 MED ORDER — HYDROCODONE-ACETAMINOPHEN 5-325 MG PO TABS: 1.0000 | ORAL_TABLET | Freq: Four times a day (QID) | ORAL | 0 refills | Status: AC | PRN

## 2020-08-10 MED ORDER — DEXAMETHASONE SODIUM PHOSPHATE 10 MG/ML IJ SOLN
INTRAMUSCULAR | Status: AC
  Filled 2020-08-10: qty 1

## 2020-08-10 MED ORDER — ONDANSETRON HCL 4 MG/2ML IJ SOLN
INTRAMUSCULAR | Status: DC | PRN
  Administered 2020-08-10: 4 mg via INTRAVENOUS

## 2020-08-10 SURGICAL SUPPLY — 52 items
ADH SKN CLS APL DERMABOND .7 (GAUZE/BANDAGES/DRESSINGS) ×1
APL PRP STRL LF DISP 70% ISPRP (MISCELLANEOUS) ×1
APPLIER CLIP 9.375 MED OPEN (MISCELLANEOUS)
APR CLP MED 9.3 20 MLT OPN (MISCELLANEOUS)
BINDER BREAST LRG (GAUZE/BANDAGES/DRESSINGS) IMPLANT
BINDER BREAST MEDIUM (GAUZE/BANDAGES/DRESSINGS) ×1 IMPLANT
BINDER BREAST XLRG (GAUZE/BANDAGES/DRESSINGS) IMPLANT
BINDER BREAST XXLRG (GAUZE/BANDAGES/DRESSINGS) IMPLANT
BLADE SURG 15 STRL LF DISP TIS (BLADE) ×1 IMPLANT
BLADE SURG 15 STRL SS (BLADE) ×2
CANISTER SUC SOCK COL 7IN (MISCELLANEOUS) IMPLANT
CANISTER SUCT 1200ML W/VALVE (MISCELLANEOUS) ×1 IMPLANT
CHLORAPREP W/TINT 26 (MISCELLANEOUS) ×2 IMPLANT
CLIP APPLIE 9.375 MED OPEN (MISCELLANEOUS) IMPLANT
COVER BACK TABLE 60X90IN (DRAPES) ×2 IMPLANT
COVER MAYO STAND STRL (DRAPES) ×2 IMPLANT
COVER PROBE W GEL 5X96 (DRAPES) ×2 IMPLANT
COVER WAND RF STERILE (DRAPES) IMPLANT
DECANTER SPIKE VIAL GLASS SM (MISCELLANEOUS) IMPLANT
DERMABOND ADVANCED (GAUZE/BANDAGES/DRESSINGS) ×1
DERMABOND ADVANCED .7 DNX12 (GAUZE/BANDAGES/DRESSINGS) ×1 IMPLANT
DRAPE LAPAROSCOPIC ABDOMINAL (DRAPES) IMPLANT
DRAPE LAPAROTOMY 100X72 PEDS (DRAPES) ×2 IMPLANT
DRAPE UTILITY XL STRL (DRAPES) ×2 IMPLANT
ELECT COATED BLADE 2.86 ST (ELECTRODE) ×2 IMPLANT
ELECT REM PT RETURN 9FT ADLT (ELECTROSURGICAL) ×2
ELECTRODE REM PT RTRN 9FT ADLT (ELECTROSURGICAL) ×1 IMPLANT
GLOVE SRG 8 PF TXTR STRL LF DI (GLOVE) ×1 IMPLANT
GLOVE SURG LTX SZ8 (GLOVE) ×2 IMPLANT
GLOVE SURG UNDER POLY LF SZ8 (GLOVE) ×2
GOWN STRL REUS W/ TWL LRG LVL3 (GOWN DISPOSABLE) ×2 IMPLANT
GOWN STRL REUS W/ TWL XL LVL3 (GOWN DISPOSABLE) ×1 IMPLANT
GOWN STRL REUS W/TWL LRG LVL3 (GOWN DISPOSABLE) ×4
GOWN STRL REUS W/TWL XL LVL3 (GOWN DISPOSABLE) ×2
HEMOSTAT ARISTA ABSORB 3G PWDR (HEMOSTASIS) IMPLANT
HEMOSTAT SNOW SURGICEL 2X4 (HEMOSTASIS) IMPLANT
KIT MARKER MARGIN INK (KITS) ×2 IMPLANT
NDL HYPO 25X1 1.5 SAFETY (NEEDLE) ×1 IMPLANT
NEEDLE HYPO 25X1 1.5 SAFETY (NEEDLE) ×2 IMPLANT
NS IRRIG 1000ML POUR BTL (IV SOLUTION) ×2 IMPLANT
PACK BASIN DAY SURGERY FS (CUSTOM PROCEDURE TRAY) ×2 IMPLANT
PENCIL SMOKE EVACUATOR (MISCELLANEOUS) ×2 IMPLANT
SLEEVE SCD COMPRESS KNEE MED (STOCKING) ×2 IMPLANT
SPONGE LAP 4X18 RFD (DISPOSABLE) ×2 IMPLANT
SUT MNCRL AB 4-0 PS2 18 (SUTURE) ×2 IMPLANT
SUT SILK 2 0 SH (SUTURE) IMPLANT
SUT VICRYL 3-0 CR8 SH (SUTURE) ×2 IMPLANT
SYR CONTROL 10ML LL (SYRINGE) ×2 IMPLANT
TOWEL GREEN STERILE FF (TOWEL DISPOSABLE) ×2 IMPLANT
TRAY FAXITRON CT DISP (TRAY / TRAY PROCEDURE) ×2 IMPLANT
TUBE CONNECTING 20X1/4 (TUBING) ×1 IMPLANT
YANKAUER SUCT BULB TIP NO VENT (SUCTIONS) ×1 IMPLANT

## 2020-08-10 NOTE — Op Note (Signed)
Preoperative diagnosis: Enlarging right breast fibroadenoma  Postoperative diagnosis: Same  Procedure: Right breast seed localized lumpectomy  Surgeon: Erroll Luna, MD  Anesthesia: LMA with 0.25% Marcaine plain  EBL: 20 cc  Specimen: Right breast tissue to pathology with seed and clip verified by Faxitron  Drains: None  IV fluids: Per anesthesia record  Indications for procedure: The patient is a 55 year old female who has had a longstanding history of a right breast mass.  Back in 2019, it was biopsied and proven to be a fibroadenoma.  It has been increasing in size and she returns for reevaluation.  Recommended excision given the fact that it was increasing in size.The procedure has been discussed with the patient. Alternatives to surgery have been discussed with the patient.  Risks of surgery include bleeding,  Infection,  Seroma formation, death,  and the need for further surgery.   The patient understands and wishes to proceed.   Description of procedure: The patient was met in the holding area and questions were answered.  Neoprobe used to verify seed location right breast upper outer quadrant 12:00.  Ultrasound was available for review.  She was then taken back to the operating.  She is placed supine upon the OR table.  After induction of general anesthesia, the right breast was prepped and draped in a sterile fashion timeout performed.  Proper patient, site and procedure were verified.  She received appropriate antibiotics.  Neoprobe used to identify the seed at 12:00.  Curvilinear incision was made along the superior border of the nipple areolar complex.  The area measured about 6 cm.  There is also additional tissue deep to this.  I excised the entire mass with a grossly negative margin.  I took an additional lateral margin as well.  All this was oriented with ink and sent to pathology.  Films revealed both seed and clip to be in the specimen.  The cavities made hemostatic.   Irrigation was used.  Vancomycin powder placed.  Incision then closed with a deep layer 3-0 Vicryl and 4 Monocryl for the skin.  Dermabond was applied.  All counts were found to be correct.  Breast binder placed.  The patient was awoke extubated taken to recovery in satisfactory condition.

## 2020-08-10 NOTE — Anesthesia Procedure Notes (Signed)
Procedure Name: LMA Insertion Performed by: Glory Buff, CRNA Pre-anesthesia Checklist: Patient identified, Emergency Drugs available, Suction available and Patient being monitored Patient Re-evaluated:Patient Re-evaluated prior to induction Oxygen Delivery Method: Circle system utilized Preoxygenation: Pre-oxygenation with 100% oxygen Induction Type: IV induction LMA: LMA inserted LMA Size: 4.0 Number of attempts: 1 Placement Confirmation: positive ETCO2 Tube secured with: Tape Dental Injury: Teeth and Oropharynx as per pre-operative assessment

## 2020-08-10 NOTE — Interval H&P Note (Signed)
History and Physical Interval Note:  08/10/2020 2:22 PM  Heather Yang  has presented today for surgery, with the diagnosis of [place].  The various methods of treatment have been discussed with the patient and family. After consideration of risks, benefits and other options for treatment, the patient has consented to  Procedure(s): RIGHT BREAST LUMPECTOMY WITH RADIOACTIVE SEED LOCALIZATION (Right) as a surgical intervention.  The patient's history has been reviewed, patient examined, no change in status, stable for surgery.  I have reviewed the patient's chart and labs.  Questions were answered to the patient's satisfaction.     [name]

## 2020-08-10 NOTE — Transfer of Care (Signed)
Immediate Anesthesia Transfer of Care Note  Patient: Heather Yang  Procedure(s) Performed: RIGHT BREAST LUMPECTOMY WITH RADIOACTIVE SEED LOCALIZATION (Right Breast)  Patient Location: PACU  Anesthesia Type:General  Level of Consciousness: drowsy and patient cooperative  Airway & Oxygen Therapy: Patient Spontanous Breathing and Patient connected to face mask oxygen  Post-op Assessment: Report given to RN and Post -op Vital signs reviewed and stable  Post vital signs: Reviewed and stable  Last Vitals:  Vitals Value Taken Time  BP 120/79 08/10/20 1551  Temp    Pulse 74 08/10/20 1552  Resp 15 08/10/20 1552  SpO2 100 % 08/10/20 1552  Vitals shown include unvalidated device data.  Last Pain:  Vitals:   08/10/20 1300  TempSrc: Oral  PainSc: 0-No pain      Patients Stated Pain Goal: 3 (49/17/91 [id number])  Complications: No complications documented.

## 2020-08-10 NOTE — Discharge Instructions (Signed)
[facility] Office Phone Number [phone]  BREAST BIOPSY/ PARTIAL MASTECTOMY: POST OP INSTRUCTIONS  Always review your discharge instruction sheet given to you by the facility where your surgery was performed.  IF YOU HAVE DISABILITY OR FAMILY LEAVE FORMS, YOU MUST BRING THEM TO THE OFFICE FOR PROCESSING.  DO NOT GIVE THEM TO YOUR DOCTOR.  1. A prescription for pain medication may be given to you upon discharge.  Take your pain medication as prescribed, if needed.  If narcotic pain medicine is not needed, then you may take acetaminophen (Tylenol) or ibuprofen (Advil) as needed. 2. Take your usually prescribed medications unless otherwise directed 3. If you need a refill on your pain medication, please contact your pharmacy.  They will contact our office to request authorization.  Prescriptions will not be filled after 5pm or on week-ends. 4. You should eat very light the first 24 hours after surgery, such as soup, crackers, pudding, etc.  Resume your normal diet the day after surgery. 5. Most patients will experience some swelling and bruising in the breast.  Ice packs and a good support bra will help.  Swelling and bruising can take several days to resolve.  6. It is common to experience some constipation if taking pain medication after surgery.  Increasing fluid intake and taking a stool softener will usually help or prevent this problem from occurring.  A mild laxative (Milk of Magnesia or Miralax) should be taken according to package directions if there are no bowel movements after 48 hours. 7. Unless discharge instructions indicate otherwise, you may remove your bandages 24-48 hours after surgery, and you may shower at that time.  You may have steri-strips (small skin tapes) in place directly over the incision.  These strips should be left on the skin for 7-10 days.  If your surgeon used skin glue on the incision, you may shower in 24 hours.  The glue will flake off over the  next 2-3 weeks.  Any sutures or staples will be removed at the office during your follow-up visit. 8. ACTIVITIES:  You may resume regular daily activities (gradually increasing) beginning the next day.  Wearing a good support bra or sports bra minimizes pain and swelling.  You may have sexual intercourse when it is comfortable. a. You may drive when you no longer are taking prescription pain medication, you can comfortably wear a seatbelt, and you can safely maneuver your car and apply brakes. b. RETURN TO WORK:  ______________________________________________________________________________________ 9. You should see your doctor in the office for a follow-up appointment approximately two weeks after your surgery.  Your doctor's nurse will typically make your follow-up appointment when she calls you with your pathology report.  Expect your pathology report 2-3 business days after your surgery.  You may call to check if you do not hear from [place] after three days. 10. OTHER INSTRUCTIONS: _______________________________________________________________________________________________ _____________________________________________________________________________________________________________________________________ _____________________________________________________________________________________________________________________________________ _____________________________________________________________________________________________________________________________________  WHEN TO CALL YOUR DOCTOR: 1. Fever over 101.0 2. Nausea and/or vomiting. 3. Extreme swelling or bruising. 4. Continued bleeding from incision. 5. Increased pain, redness, or drainage from the incision.  The clinic staff is available to answer your questions during regular business hours.  Please don't hesitate to call and ask to speak to one of the nurses for clinical concerns.  If you have a medical emergency, go to the nearest  emergency room or call 911.  A surgeon from [facility] is always on call at the hospital.  For further questions, please visit [email or web address]  No tylenol or ibuprofen until 7pm  Post Anesthesia Home Care Instructions  Activity: Get plenty of rest for the remainder of the day. A responsible individual must stay with you for 24 hours following the procedure.  For the next 24 hours, DO NOT: -Drive a car -Paediatric nurse -Drink alcoholic beverages -Take any medication unless instructed by your physician -Make any legal decisions or sign important papers.  Meals: Start with liquid foods such as gelatin or soup. Progress to regular foods as tolerated. Avoid greasy, spicy, heavy foods. If nausea and/or vomiting occur, drink only clear liquids until the nausea and/or vomiting subsides. Call your physician if vomiting continues.  Special Instructions/Symptoms: Your throat may feel dry or sore from the anesthesia or the breathing tube placed in your throat during surgery. If this causes discomfort, gargle with warm salt water. The discomfort should disappear within 24 hours.  If you had a scopolamine patch placed behind your ear for the management of post- operative nausea and/or vomiting:  1. The medication in the patch is effective for 72 hours, after which it should be removed.  Wrap patch in a tissue and discard in the trash. Wash hands thoroughly with soap and water. 2. You may remove the patch earlier than 72 hours if you experience unpleasant side effects which may include dry mouth, dizziness or visual disturbances. 3. Avoid touching the patch. Wash your hands with soap and water after contact with the patch.

## 2020-08-10 NOTE — Anesthesia Preprocedure Evaluation (Addendum)
Anesthesia Evaluation  Patient identified by MRN, date of birth, ID band Patient awake    Reviewed: Allergy & Precautions, NPO status , Patient's Chart, lab work & pertinent test results  Airway Mallampati: II  TM Distance: >3 FB Neck ROM: Full    Dental no notable dental hx.    Pulmonary neg pulmonary ROS,    Pulmonary exam normal breath sounds clear to auscultation       Cardiovascular negative cardio ROS Normal cardiovascular exam Rhythm:Regular Rate:Normal     Neuro/Psych negative neurological ROS  negative psych ROS   GI/Hepatic negative GI ROS, Neg liver ROS,   Endo/Other  negative endocrine ROS  Renal/GU negative Renal ROS  negative genitourinary   Musculoskeletal negative musculoskeletal ROS (+)   Abdominal   Peds  Hematology negative hematology ROS (+)   Anesthesia Other Findings   Reproductive/Obstetrics                             Anesthesia Physical Anesthesia Plan  ASA: I  Anesthesia Plan: General   Post-op Pain Management:    Induction: Intravenous  PONV Risk Score and Plan: 3 and Ondansetron, Dexamethasone and Midazolam  Airway Management Planned: LMA  Additional Equipment:   Intra-op Plan:   Post-operative Plan: Extubation in OR  Informed Consent: I have reviewed the patients History and Physical, chart, labs and discussed the procedure including the risks, benefits and alternatives for the proposed anesthesia with the patient or authorized representative who has indicated his/her understanding and acceptance.     Dental advisory given  Plan Discussed with: CRNA  Anesthesia Plan Comments:         Anesthesia Quick Evaluation

## 2020-08-11 ENCOUNTER — Encounter (HOSPITAL_BASED_OUTPATIENT_CLINIC_OR_DEPARTMENT_OTHER): Payer: Self-pay | Admitting: Surgery

## 2020-08-12 NOTE — Anesthesia Postprocedure Evaluation (Signed)
Anesthesia Post Note  Patient: Heather Yang  Procedure(s) Performed: RIGHT BREAST LUMPECTOMY WITH RADIOACTIVE SEED LOCALIZATION (Right Breast)     Patient location during evaluation: PACU Anesthesia Type: General Level of consciousness: awake and alert Pain management: pain level controlled Vital Signs Assessment: post-procedure vital signs reviewed and stable Respiratory status: spontaneous breathing, nonlabored ventilation, respiratory function stable and patient connected to nasal cannula oxygen Cardiovascular status: blood pressure returned to baseline and stable Postop Assessment: no apparent nausea or vomiting Anesthetic complications: no   No complications documented.  Last Vitals:  Vitals:   08/10/20 1615 08/10/20 1638  BP: 134/80 124/78  Pulse: 60 (!) 56  Resp: 18 18  Temp:  (!) 36.1 C  SpO2: 99% 98%    Last Pain:  Vitals:   08/10/20 1638  TempSrc: Axillary  PainSc: 3    Pain Goal: Patients Stated Pain Goal: 3 (08/10/20 1638)                 [name]

## 2020-08-16 LAB — SURGICAL PATHOLOGY

## 2021-02-13 DIAGNOSIS — M79644 Pain in right finger(s): Secondary | ICD-10-CM | POA: Diagnosis not present

## 2021-02-23 ENCOUNTER — Other Ambulatory Visit: Payer: Self-pay | Admitting: Family Medicine

## 2021-02-23 ENCOUNTER — Ambulatory Visit
Admission: RE | Admit: 2021-02-23 | Discharge: 2021-02-23 | Disposition: A | Discharge status: Home / Self Care | Payer: BC Managed Care – PPO | Source: Ambulatory Visit | Attending: Family Medicine | Admitting: Family Medicine

## 2021-02-23 DIAGNOSIS — M79644 Pain in right finger(s): Secondary | ICD-10-CM

## 2021-02-23 DIAGNOSIS — M7989 Other specified soft tissue disorders: Secondary | ICD-10-CM | POA: Diagnosis not present

## 2021-06-22 DIAGNOSIS — E785 Hyperlipidemia, unspecified: Secondary | ICD-10-CM | POA: Diagnosis not present

## 2021-06-22 DIAGNOSIS — Z124 Encounter for screening for malignant neoplasm of cervix: Secondary | ICD-10-CM | POA: Diagnosis not present

## 2021-06-22 DIAGNOSIS — E559 Vitamin D deficiency, unspecified: Secondary | ICD-10-CM | POA: Diagnosis not present

## 2021-06-22 DIAGNOSIS — Z0000 Encounter for general adult medical examination without abnormal findings: Secondary | ICD-10-CM | POA: Diagnosis not present

## 2021-06-27 ENCOUNTER — Encounter (HOSPITAL_COMMUNITY): Payer: Self-pay

## 2022-07-13 DIAGNOSIS — Z0000 Encounter for general adult medical examination without abnormal findings: Secondary | ICD-10-CM | POA: Diagnosis not present

## 2022-11-19 IMAGING — DX MM BREAST SURGICAL SPECIMEN
1 series · 2 of 2 positions shown · non-contrast
Comparison: Previous exam(s).

CLINICAL DATA: Status post surgical excision today after earlier
radioactive seed localization.

EXAM:
SPECIMEN RADIOGRAPH OF THE RIGHT BREAST

[Series 1: specimen digital x-ray · right · 0.07mm/px · 2 of 2 slices shown]
[im 1/2]
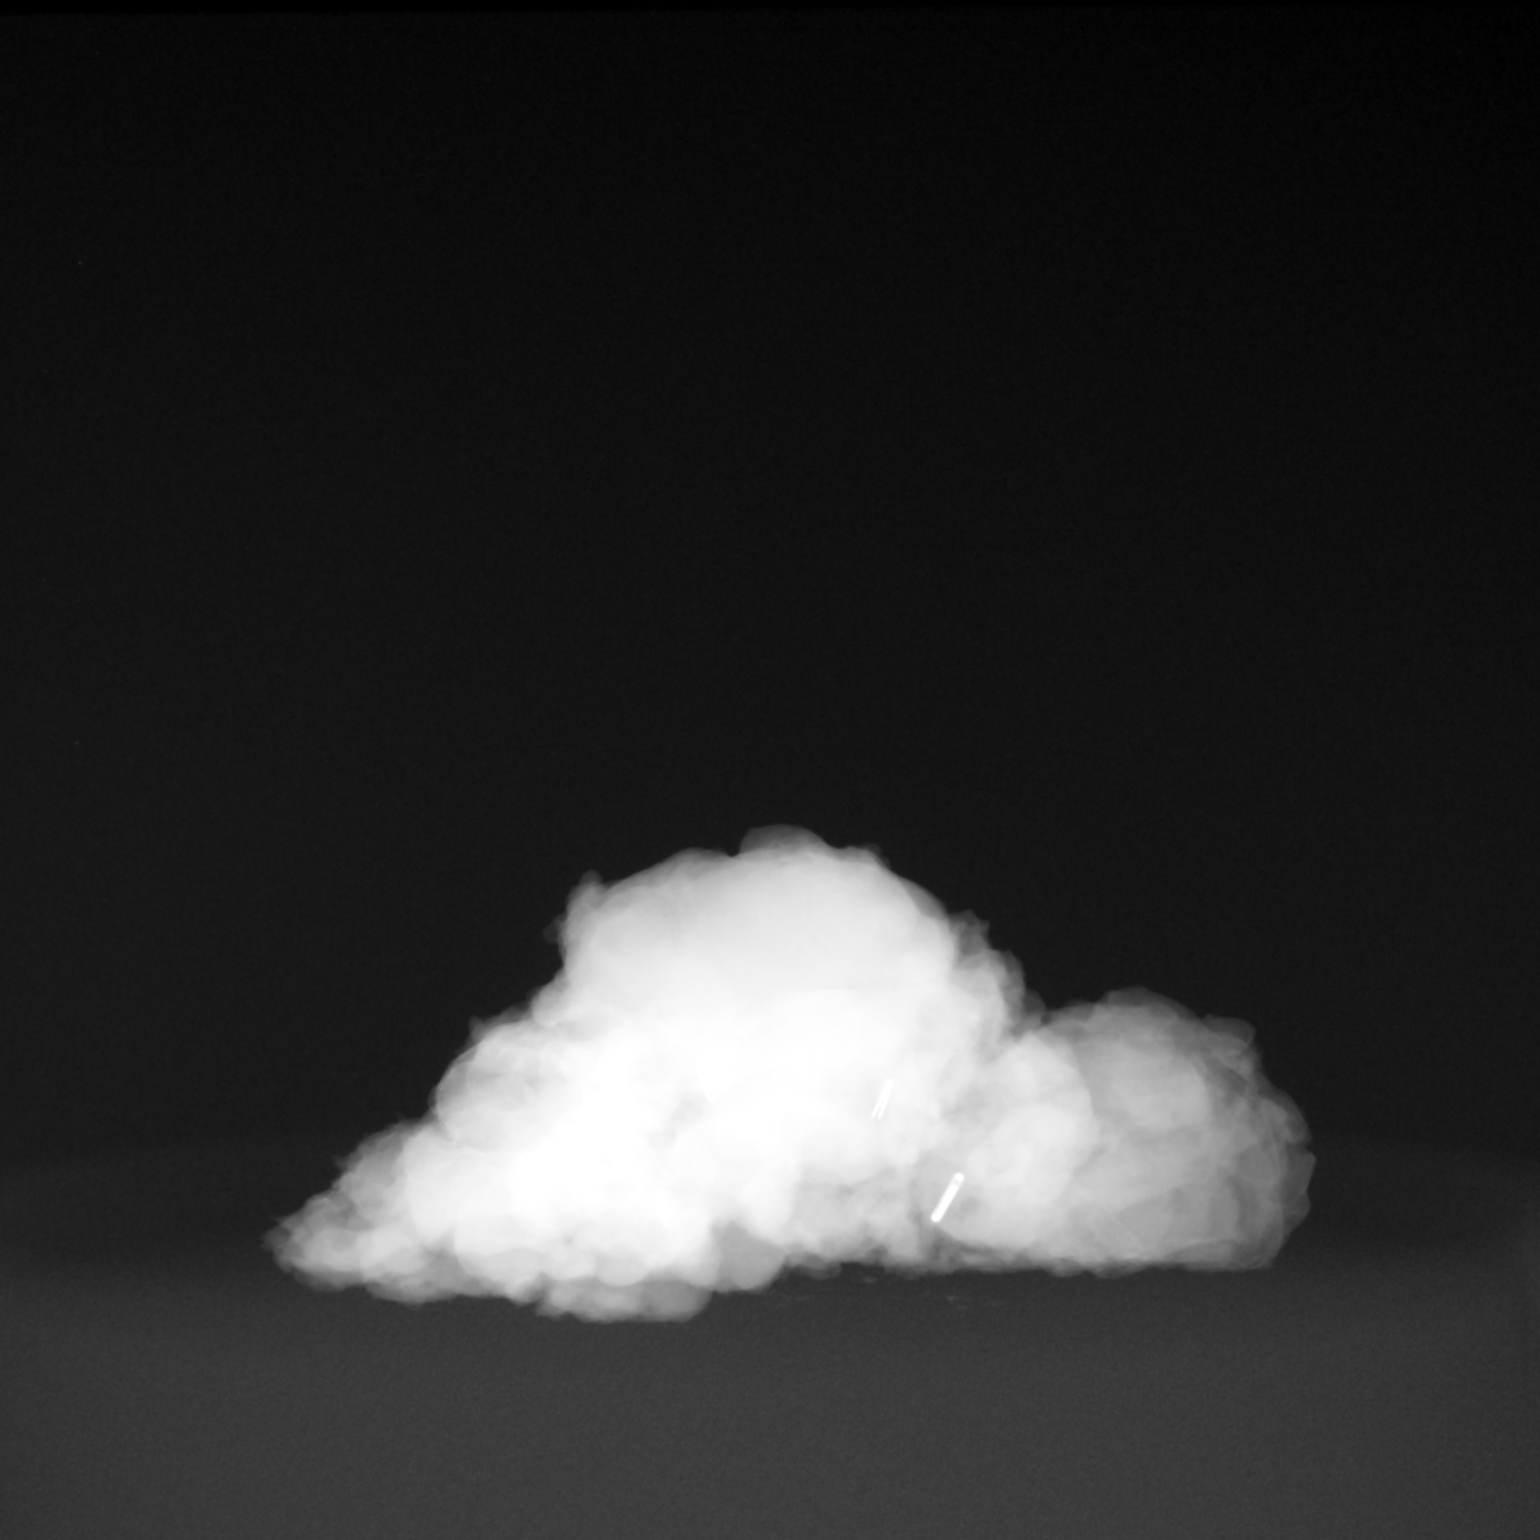
[im 2/2]
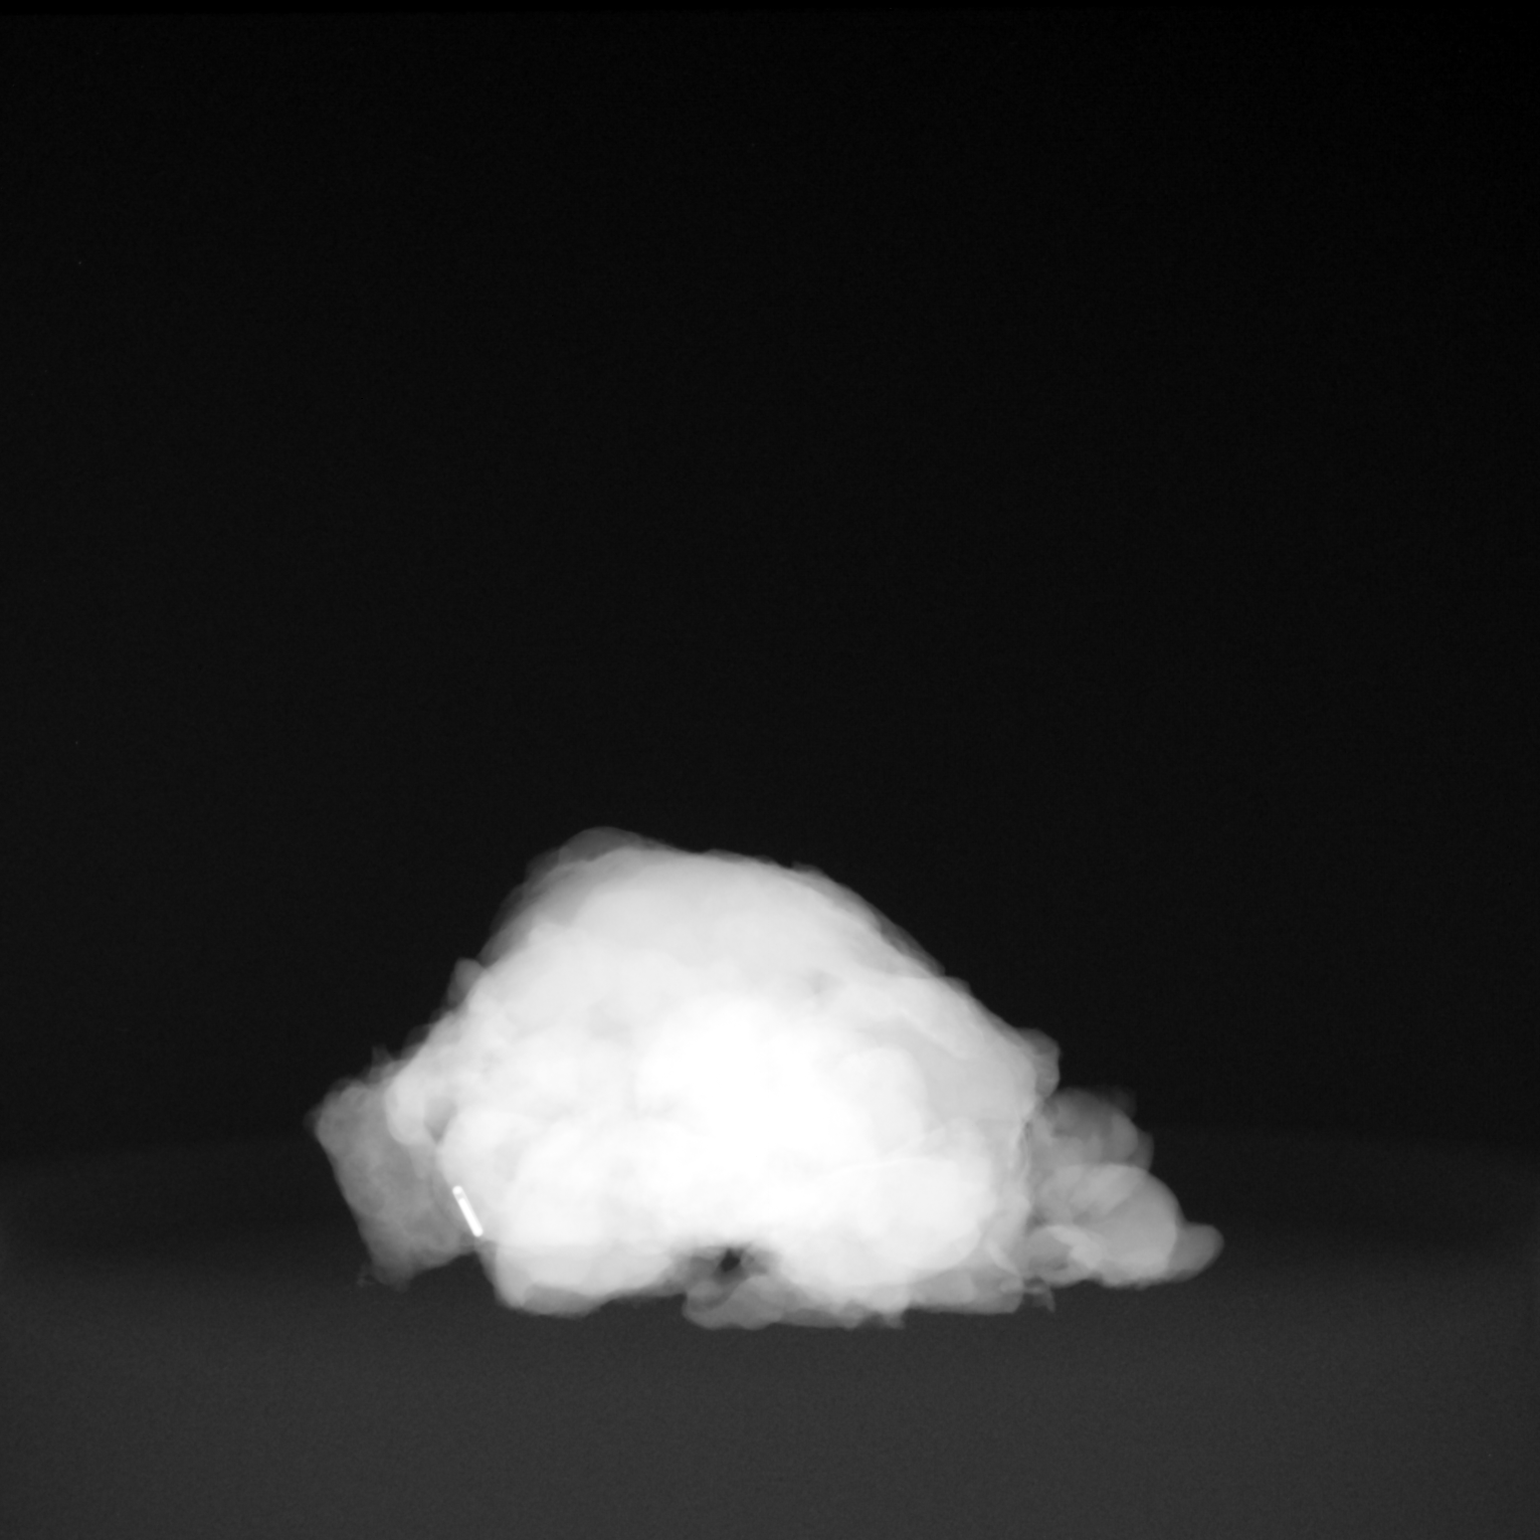

[2 of 2 positions shown; findings below may reference images not displayed]

FINDINGS: Status post excision of the right breast. The radioactive seed and
biopsy marker clip are present and completely intact within the
specimen. Findings discussed with the OR staff during the procedure.
IMPRESSION: Specimen radiograph of the right breast.

## 2023-06-04 IMAGING — CR DG HAND 2V*R*
2 series · 2 of 2 positions shown · non-contrast
Comparison: None.

CLINICAL DATA: Pain of right thumb. Right thumb interphalangeal
joint swelling.

EXAM:
RIGHT HAND - 2 VIEW

[x hand pa right]
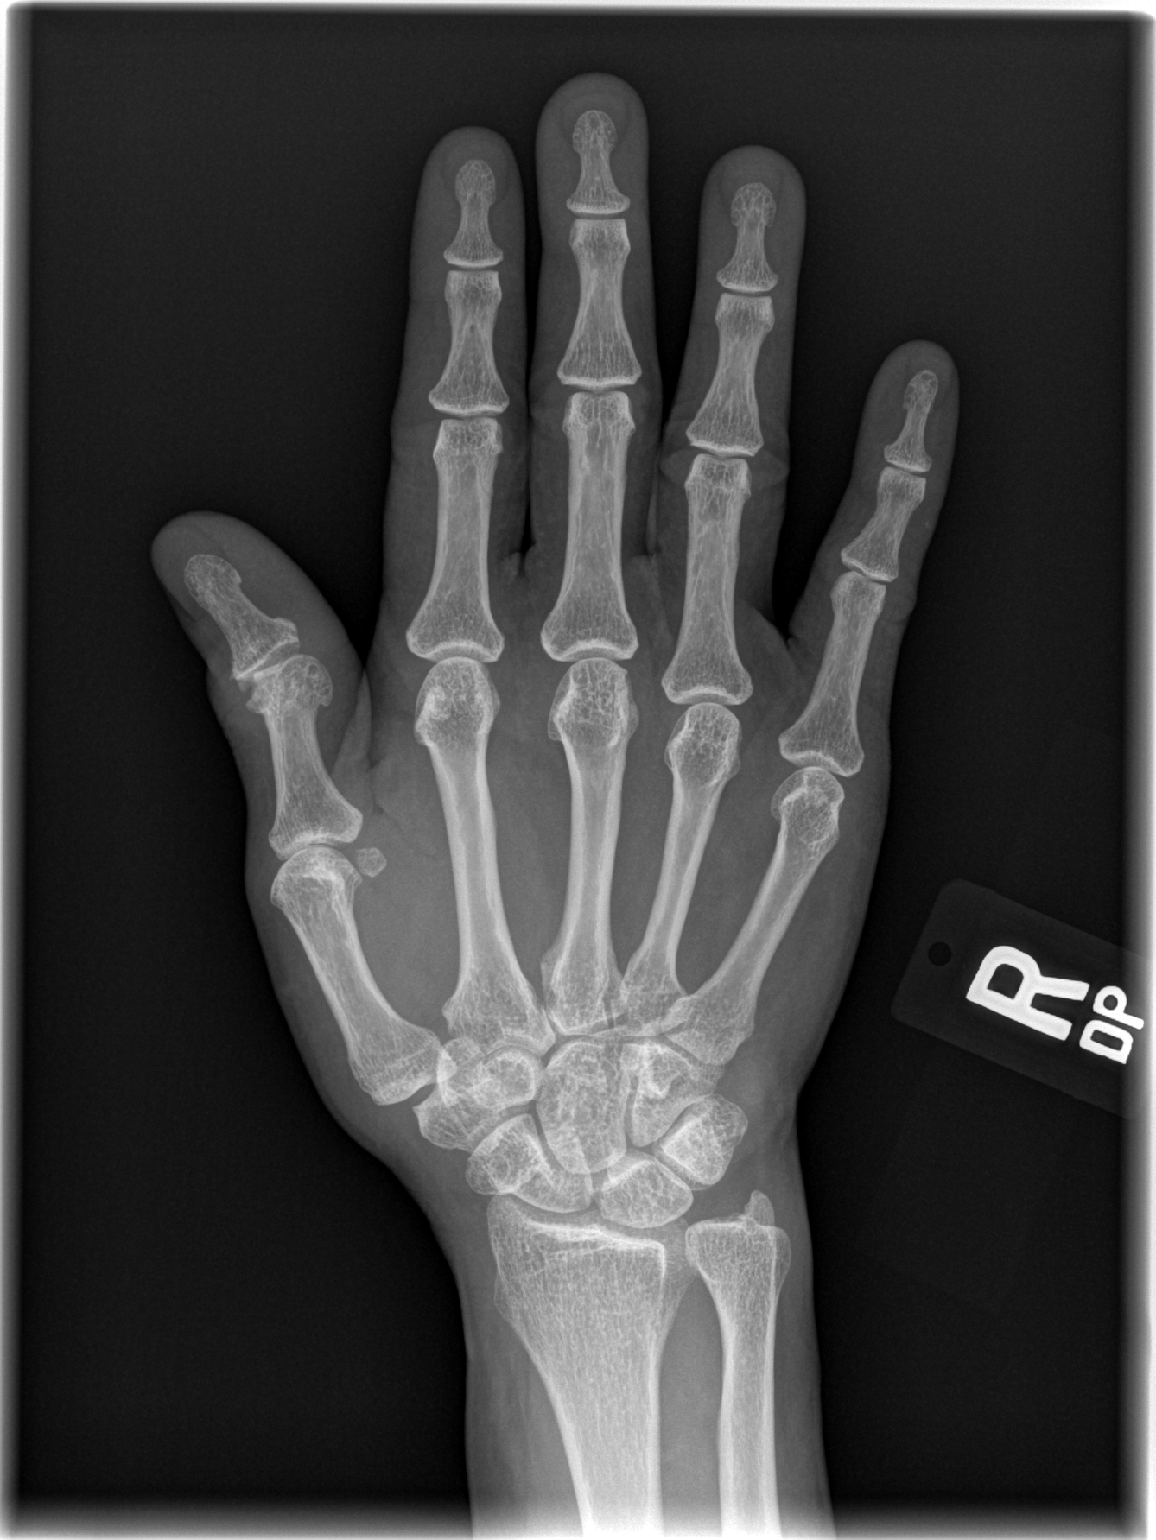

[x hand lat right]
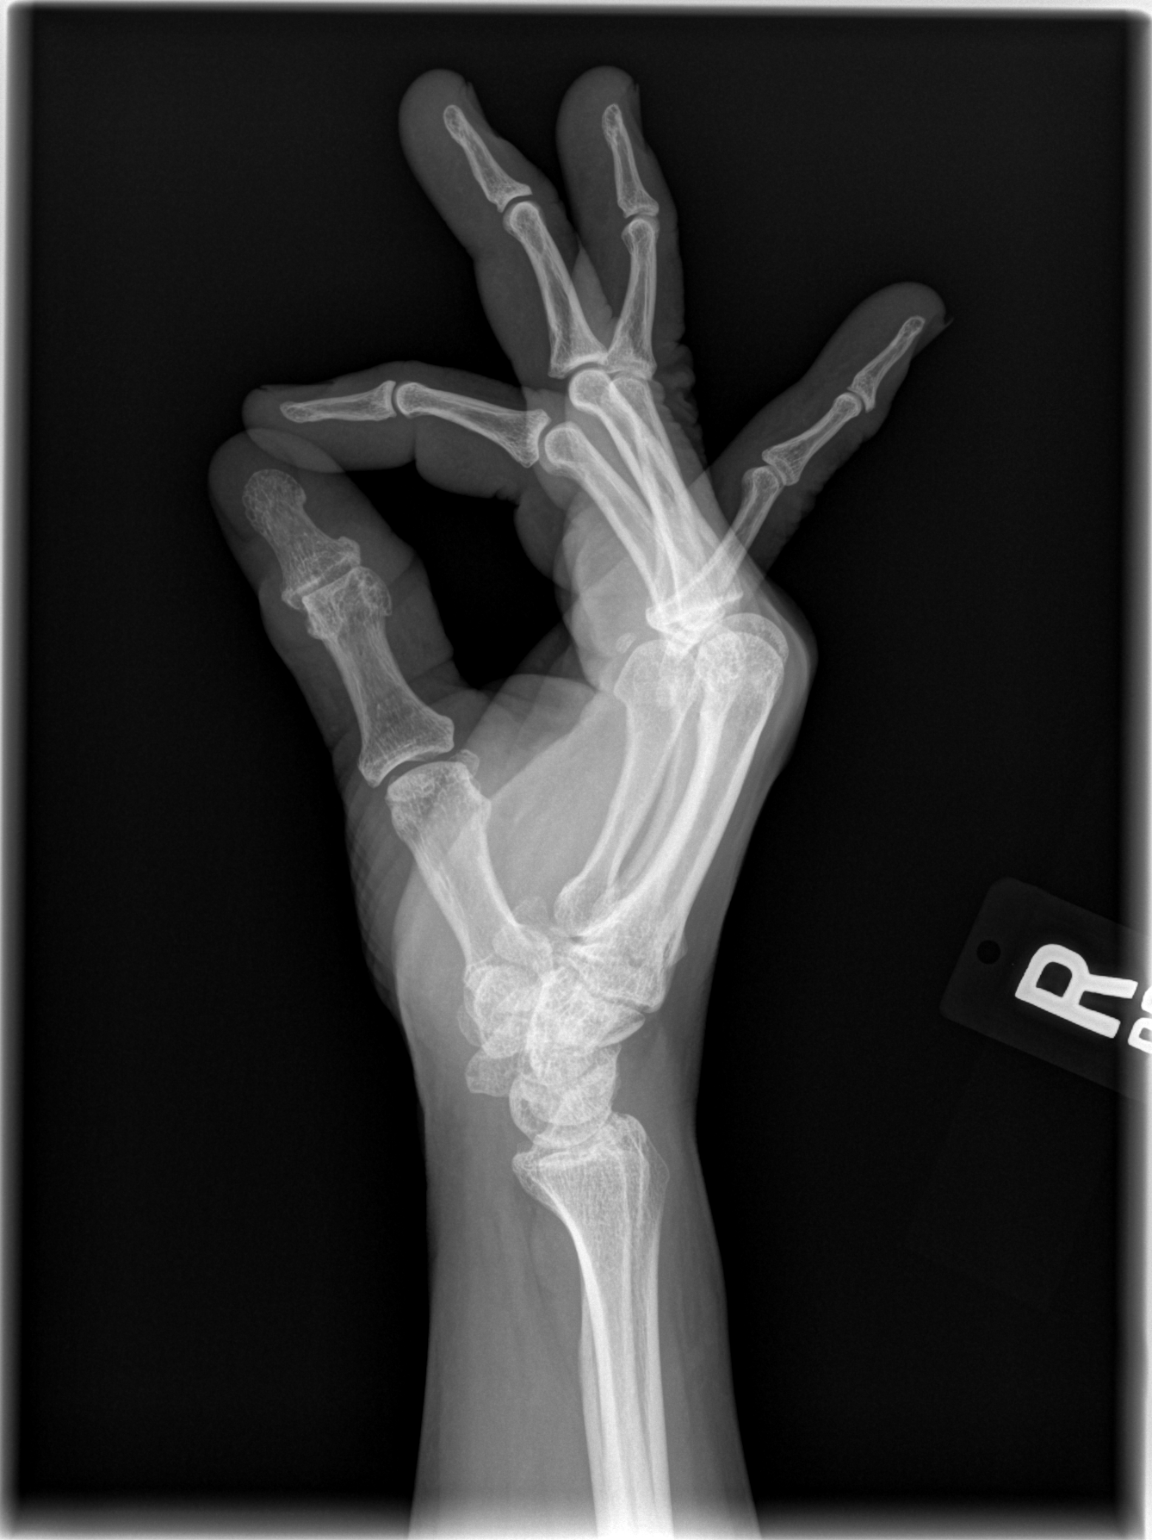

[2 of 2 positions shown; findings below may reference images not displayed]

FINDINGS: Radial joint space narrowing with peripheral osteophytes of the
interphalangeal joint of the thumb. No erosion. Occasional
additional osteophytes involving the metacarpal phalangeal joints
and distal interphalangeal joints of the digits. No fracture,
periostitis, or erosive change. No soft tissue calcifications.
IMPRESSION: Osteoarthritis of the interphalangeal joint of the thumb. No
evidence of inflammatory or erosive arthropathy.

## 2023-11-28 ENCOUNTER — Other Ambulatory Visit: Payer: Self-pay

## 2023-11-29 LAB — SURGICAL PATHOLOGY
